# Patient Record
Sex: Male | Born: 1995 | Hispanic: No | Marital: Single | Smoking: Never smoker
Health system: Southern US, Community
[De-identification: ages and names within clinical notes are randomized; demographics above are authoritative.]

---
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
## 2014-06-02 ENCOUNTER — Encounter (HOSPITAL_COMMUNITY): Payer: Self-pay | Admitting: Nurse Practitioner

## 2014-06-02 ENCOUNTER — Emergency Department (HOSPITAL_COMMUNITY)
Admission: EM | Admit: 2014-06-02 | Discharge: 2014-06-02 | Disposition: A | Discharge status: Home / Self Care | Payer: Medicaid Other | Attending: Emergency Medicine | Admitting: Emergency Medicine

## 2014-06-02 DIAGNOSIS — J020 Streptococcal pharyngitis: Secondary | ICD-10-CM

## 2014-06-02 LAB — RAPID STREP SCREEN (MED CTR MEBANE ONLY): Streptococcus, Group A Screen (Direct): POSITIVE — AB

## 2014-06-02 MED ORDER — AMOXICILLIN 500 MG PO CAPS: 500.0000 mg | ORAL_CAPSULE | Freq: Three times a day (TID) | ORAL | Status: AC

## 2014-06-02 NOTE — Discharge Instructions (Signed)
Please take all of your antibiotics until finished!   You may develop abdominal discomfort or diarrhea from the antibiotic.  You may help offset this with probiotics which you can buy or get in yogurt. Do not eat  or take the probiotics until 2 hours after your antibiotic.   Strep Throat Strep throat is an infection of the throat caused by a bacteria named Streptococcus pyogenes. Your health care provider may call the infection streptococcal "tonsillitis" or "pharyngitis" depending on whether there are signs of inflammation in the tonsils or back of the throat. Strep throat is most common in children aged 5-15 years during the cold months of the year, but it can occur in people of any age during any season. This infection is spread from person to person (contagious) through coughing, sneezing, or other close contact. SIGNS AND SYMPTOMS   Fever or chills.  Painful, swollen, red tonsils or throat.  Pain or difficulty when swallowing.  White or yellow spots on the tonsils or throat.  Swollen, tender lymph nodes or "glands" of the neck or under the jaw.  Red rash all over the body (rare). DIAGNOSIS  Many different infections can cause the same symptoms. A test must be done to confirm the diagnosis so the right treatment can be given. A "rapid strep test" can help your health care provider make the diagnosis in a few minutes. If this test is not available, a light swab of the infected area can be used for a throat culture test. If a throat culture test is done, results are usually available in a day or two. TREATMENT  Strep throat is treated with antibiotic medicine. HOME CARE INSTRUCTIONS   Gargle with 1 tsp of salt in 1 cup of warm water, 3-4 times per day or as needed for comfort.  Family members who also have a sore throat or fever should be tested for strep throat and treated with antibiotics if they have the strep infection.  Make sure everyone in your household washes their hands  well.  Do not share food, drinking cups, or personal items that could cause the infection to spread to others.  You may need to eat a soft food diet until your sore throat gets better.  Drink enough water and fluids to keep your urine clear or pale yellow. This will help prevent dehydration.  Get plenty of rest.  Stay home from school, day care, or work until you have been on antibiotics for 24 hours.  Take medicines only as directed by your health care provider.  Take your antibiotic medicine as directed by your health care provider. Finish it even if you start to feel better. SEEK MEDICAL CARE IF:   The glands in your neck continue to enlarge.  You develop a rash, cough, or earache.  You cough up green, yellow-brown, or bloody sputum.  You have pain or discomfort not controlled by medicines.  Your problems seem to be getting worse rather than better.  You have a fever. SEEK IMMEDIATE MEDICAL CARE IF:   You develop any new symptoms such as vomiting, severe headache, stiff or painful neck, chest pain, shortness of breath, or trouble swallowing.  You develop severe throat pain, drooling, or changes in your voice.  You develop swelling of the neck, or the skin on the neck becomes red and tender.  You develop signs of dehydration, such as fatigue, dry mouth, and decreased urination.  You become increasingly sleepy, or you cannot wake up completely. MAKE SURE  YOU:  Understand these instructions.  Will watch your condition.  Will get help right away if you are not doing well or get worse. Document Released: 06/12/2000 Document Revised: 10/30/2013 Document Reviewed: 08/14/2010 [facility] Patient Information 2015 [employer], [place]. This information is not intended to replace advice given to you by your health care provider. Make sure you discuss any questions you have with your health care provider.

## 2014-06-02 NOTE — ED Provider Notes (Signed)
CSN: [id number]     Arrival date & time 06/02/14  1054 History  This chart was scribed for Arthor CaptainAbigail Finlee Milo, PA-C, working with Flint MelterElliott L Wentz, MD by Chestine SporeSoijett Blue, ED Scribe. The patient was seen in room TR06C/TR06C at 11:34 AM.    Chief Complaint  Patient presents with  . Jaw Pain    The history is provided by the patient. No language interpreter was used.   HPI Comments: Tony Carey is a 18 y.o. male who presents to the Emergency Department complaining of right jaw pain onset wednesday.  There is pain hen he swallows. He states that he is having associated symptoms of voice change. He states that he has tried Advil 10 AM PTA with no relief for his symptoms. He denies ear pain, rhinorrhea, sore throat, cough, tooth pain, vomiting, and any other symptoms.  History reviewed. No pertinent past medical history. History reviewed. No pertinent past surgical history. History reviewed. No pertinent family history. History  Substance Use Topics  . Smoking status: Never Smoker   . Smokeless tobacco: Not on file  . Alcohol Use: No    Review of Systems  HENT: Positive for facial swelling (right jaw swelling) and voice change. Negative for dental problem, ear pain and sore throat.   Respiratory: Negative for cough.   Gastrointestinal: Negative for vomiting.      Allergies  Review of patient's allergies indicates no known allergies.  Home Medications   Prior to Admission medications   Not on File   BP 134/77 mmHg  Pulse 99  Temp(Src) 98.6 F (37 C) (Oral)  Resp 16  SpO2 97%  Physical Exam  Constitutional: He is oriented to person, place, and time. He appears well-developed and well-nourished. No distress.  HENT:  Head: Normocephalic and atraumatic.  Mouth/Throat: Uvula is midline. Posterior oropharyngeal erythema present. No oropharyngeal exudate or tonsillar abscesses.  Hypertrophic right slightly greater than left.  Eyes: EOM are normal.  Neck: Neck supple. No  tracheal deviation present.  Cardiovascular: Normal rate.   Pulmonary/Chest: Effort normal. No respiratory distress.  Musculoskeletal: Normal range of motion.  Lymphadenopathy:       Head (right side): Tonsillar adenopathy present.  Neurological: He is alert and oriented to person, place, and time.  Skin: Skin is warm and dry.  Psychiatric: He has a normal mood and affect. His behavior is normal.  Nursing note and vitals reviewed.   ED Course  Procedures (including critical care time) DIAGNOSTIC STUDIES: Oxygen Saturation is 97% on room air, normal by my interpretation.    COORDINATION OF CARE: 11:42 AM-Discussed treatment plan which includes rapid strep with pt at bedside and pt agreed to plan.   Labs Review Labs Reviewed - No data to display  Imaging Review No results found.   EKG Interpretation None      MDM   Final diagnoses:  Strep throat    Pt w/ cervical lymphadenopathy diagnosis of strep  Pt Presentation non concerning for PTA or infxn spread to soft tissue. No trismus or uvula deviation. Specific return precautions discussed. Pt able to drink water in ED without difficulty with intact air way. Recommended PCP follow up.    I personally performed the services described in this documentation, which was scribed in my presence. The recorded information has been reviewed and is accurate.    Arthor Captainbigail Marsia Cino, PA-C 06/02/14 1319  Flint MelterElliott L Wentz, MD 06/02/14 [phone]

## 2014-06-02 NOTE — ED Notes (Signed)
he's had pain and swelling to R jaw since Wednesday. He took advil with some relief. Denies sore throat, cough. He did have chills before he took the advil

## 2014-06-02 NOTE — ED Notes (Signed)
Declined W/C at D/C and was escorted to lobby by RN. 

## 2018-06-28 ENCOUNTER — Other Ambulatory Visit: Payer: Self-pay

## 2018-06-28 ENCOUNTER — Encounter (HOSPITAL_COMMUNITY): Payer: Self-pay | Admitting: Emergency Medicine

## 2018-06-28 ENCOUNTER — Emergency Department (HOSPITAL_COMMUNITY)
Admission: EM | Admit: 2018-06-28 | Discharge: 2018-06-28 | Disposition: A | Discharge status: Home / Self Care | Payer: Self-pay | Attending: Emergency Medicine | Admitting: Emergency Medicine

## 2018-06-28 DIAGNOSIS — J029 Acute pharyngitis, unspecified: Secondary | ICD-10-CM

## 2018-06-28 LAB — GROUP A STREP BY PCR: GROUP A STREP BY PCR: NOT DETECTED

## 2018-06-28 MED ORDER — ACETAMINOPHEN 500 MG PO TABS
1000.0000 mg | ORAL_TABLET | Freq: Once | ORAL | Status: AC
  Administered 2018-06-28: 1000 mg via ORAL
  Filled 2018-06-28: qty 2

## 2018-06-28 NOTE — ED Provider Notes (Signed)
[name]   CSN: [id number] Arrival date & time: 06/28/18  [id number]     History   Chief Complaint Chief Complaint  Patient presents with  . Sore Throat  . Cough  . Fever    HPI Tony PortelaDaniel Carey is a 22 y.o. male.  HPI   Patient is a 22 year old male with no significant past medical history presents the emergency department today for evaluation of a sore throat that began yesterday.  Patient reports severe pain to the left side of his throat that is worse with swallowing.  He has tried taking DayQuil and NyQuil without significant relief.  He also reports rhinorrhea, congestion, fevers and chills.  No significant cough or shortness of breath or abdominal pain.  History reviewed. No pertinent past medical history.  There are no active problems to display for this patient.   History reviewed. No pertinent surgical history.      Home Medications    Prior to Admission medications   Medication Sig Start Date End Date Taking? Authorizing Provider  amoxicillin (AMOXIL) 500 MG capsule Take 1 capsule (500 mg total) by mouth 3 (three) times daily. 06/02/14   Arthor CaptainHarris, Abigail, PA-C    Family History No family history on file.  Social History Social History   Tobacco Use  . Smoking status: Never Smoker  . Smokeless tobacco: Never Used  Substance Use Topics  . Alcohol use: No  . Drug use: No     Allergies   Patient has no known allergies.   Review of Systems Review of Systems  Constitutional: Positive for chills and fever.  HENT: Positive for congestion, rhinorrhea and sore throat.   Eyes: Negative for visual disturbance.  Respiratory: Negative for cough and shortness of breath.   Cardiovascular: Negative for chest pain.  Gastrointestinal: Negative for abdominal pain, constipation, diarrhea, nausea and vomiting.  Genitourinary: Negative for dysuria.  Musculoskeletal: Negative for back pain.  Neurological:  Negative for headaches.   Physical Exam Updated Vital Signs BP 140/82 (BP Location: Right Arm)   Pulse (!) 106   Temp 99.6 F (37.6 C) (Oral)   Resp 17   SpO2 99%   Physical Exam Vitals signs and nursing note reviewed.  Constitutional:      Appearance: He is well-developed.  HENT:     Head: Normocephalic and atraumatic.     Nose: No congestion or rhinorrhea.     Mouth/Throat:     Mouth: Mucous membranes are moist.     Pharynx: Posterior oropharyngeal erythema present.     Comments: Posterior pharyngeal edema.  No tonsillar edema or exudates. Eyes:     Conjunctiva/sclera: Conjunctivae normal.  Neck:     Musculoskeletal: Neck supple.  Cardiovascular:     Rate and Rhythm: Regular rhythm. Tachycardia present.     Heart sounds: Normal heart sounds. No murmur.  Pulmonary:     Effort: Pulmonary effort is normal. No respiratory distress.     Breath sounds: Normal breath sounds. No stridor. No wheezing or rhonchi.  Abdominal:     General: Bowel sounds are normal.     Palpations: Abdomen is soft.     Tenderness: There is no abdominal tenderness.  Lymphadenopathy:     Cervical: Cervical adenopathy present.  Skin:    General: Skin is warm and dry.  Neurological:     Mental Status: He is alert.     ED Treatments / Results  Labs (all labs ordered are listed, but only abnormal  results are displayed) Labs Reviewed  GROUP A STREP BY PCR    EKG None  Radiology No results found.  Procedures Procedures (including critical care time)  Medications Ordered in ED Medications  acetaminophen (TYLENOL) tablet 1,000 mg (1,000 mg Oral Given 06/28/18 [id number])     Initial Impression / Assessment and Plan / ED Course  I have reviewed the triage vital signs and the nursing notes.  Pertinent labs & imaging results that were available during my care of the patient were reviewed by me and considered in my medical decision making (see chart for details).   Final Clinical  Impressions(s) / ED Diagnoses   Final diagnoses:  Pharyngitis, unspecified etiology    Pt afebrile without tonsillar exudate, negative strep. Presents with mild cervical lymphadenopathy, & dysphagia; diagnosis of viral pharyngitis. No abx indicated. DC w symptomatic tx for pain  Pt does not appear dehydrated, but did discuss importance of water rehydration. Presentation non concerning for PTA or infxn spread to soft tissue. No trismus or uvula deviation. Specific return precautions discussed. Pt able to drink water in ED without difficulty with intact air way. Recommended PCP follow up.   ED Discharge Orders    None       Rayne DuCouture, Jerame Hedding S, PA-C 06/28/18 1141    Maia PlanLong, Joshua G, MD 06/28/18 1919

## 2018-06-28 NOTE — ED Triage Notes (Signed)
Pt. Stated, I have strep throat , cough with a fever since yesterday.

## 2018-06-28 NOTE — Discharge Instructions (Addendum)
You may alternate taking Tylenol and Ibuprofen as needed for pain control. You may take 400-600 mg of ibuprofen every 6 hours and 500-1000 mg of Tylenol every 6 hours. Do not exceed 4000 mg of Tylenol daily as this can lead to liver damage. Also, make sure to take Ibuprofen with meals as it can cause an upset stomach. Do not take other NSAIDs while taking Ibuprofen such as (Aleve, Naprosyn, Aspirin, Celebrex, etc) and do not take more than the prescribed dose as this can lead to ulcers and bleeding in your GI tract. ° °Please follow up with your primary doctor within the next 7-10 days for re-evaluation and further treatment of your symptoms.  ° °Please return to the ER sooner if you have any new or worsening symptoms. ° °

## 2018-07-18 ENCOUNTER — Other Ambulatory Visit: Payer: Self-pay

## 2018-07-18 ENCOUNTER — Emergency Department (HOSPITAL_COMMUNITY)
Admission: EM | Admit: 2018-07-18 | Discharge: 2018-07-18 | Disposition: A | Discharge status: Home / Self Care | Payer: Self-pay | Attending: Emergency Medicine | Admitting: Emergency Medicine

## 2018-07-18 ENCOUNTER — Encounter (HOSPITAL_COMMUNITY): Payer: Self-pay | Admitting: Emergency Medicine

## 2018-07-18 DIAGNOSIS — J020 Streptococcal pharyngitis: Secondary | ICD-10-CM | POA: Insufficient documentation

## 2018-07-18 LAB — GROUP A STREP BY PCR: GROUP A STREP BY PCR: DETECTED — AB

## 2018-07-18 MED ORDER — IBUPROFEN 800 MG PO TABS: 800.0000 mg | ORAL_TABLET | Freq: Once | ORAL | Status: DC

## 2018-07-18 MED ORDER — DEXAMETHASONE SODIUM PHOSPHATE 10 MG/ML IJ SOLN
10.0000 mg | Freq: Once | INTRAMUSCULAR | Status: AC
  Administered 2018-07-18: 10 mg via INTRAMUSCULAR
  Filled 2018-07-18: qty 1

## 2018-07-18 MED ORDER — PENICILLIN G BENZATHINE 1200000 UNIT/2ML IM SUSP
1.2000 10*6.[IU] | Freq: Once | INTRAMUSCULAR | Status: AC
  Administered 2018-07-18: 1.2 10*6.[IU] via INTRAMUSCULAR
  Filled 2018-07-18: qty 2

## 2018-07-18 MED ORDER — IBUPROFEN 800 MG PO TABS
800.0000 mg | ORAL_TABLET | Freq: Once | ORAL | Status: AC
  Administered 2018-07-18: 800 mg via ORAL
  Filled 2018-07-18: qty 1

## 2018-07-18 NOTE — ED Triage Notes (Addendum)
Pt reports R sided throat pain since Friday.  Seen on 12/31 for pharyngitis.  States he has difficulty swallowing and has been drooling.  Handling secretions at this time.

## 2018-07-18 NOTE — ED Notes (Signed)
Pt reports 3 days of a sore throat with no fever, nausea, or vomiting.

## 2018-07-18 NOTE — ED Notes (Signed)
Patient verbalizes understanding of discharge instructions. Opportunity for questioning and answers were provided. Armband removed by staff, pt discharged from ED home via POV.  

## 2018-07-18 NOTE — ED Provider Notes (Signed)
TIME SEEN: 1:36 AM  CHIEF COMPLAINT: Sore throat  HPI: Patient is a 23 year old male with no significant past medical history who presents to the emergency department with sore throat for the past 3 days.  No known sick contacts.  No fever.  No cough.  No vomiting or diarrhea.  Pain with swallowing.  No changes in his voice.  Able to open mouth fully.  ROS: See HPI Constitutional: no fever  Eyes: no drainage  ENT: no runny nose   Cardiovascular:  no chest pain  Resp: no SOB  GI: no vomiting GU: no dysuria Integumentary: no rash  Allergy: no hives  Musculoskeletal: no leg swelling  Neurological: no slurred speech ROS otherwise negative  PAST MEDICAL HISTORY/PAST SURGICAL HISTORY:  History reviewed. No pertinent past medical history.  MEDICATIONS:  Prior to Admission medications   Medication Sig Start Date End Date Taking? Authorizing Provider  amoxicillin (AMOXIL) 500 MG capsule Take 1 capsule (500 mg total) by mouth 3 (three) times daily. 06/02/14   Arthor Captain, PA-C    ALLERGIES:  No Known Allergies  SOCIAL HISTORY:  Social History   Tobacco Use  . Smoking status: Never Smoker  . Smokeless tobacco: Never Used  Substance Use Topics  . Alcohol use: No    FAMILY HISTORY: No family history on file.  EXAM: BP 137/88 (BP Location: Left Arm)   Pulse 93   Temp 100.3 F (37.9 C) (Oral)   Resp 16   SpO2 97%  CONSTITUTIONAL: Alert and oriented and responds appropriately to questions. Well-appearing; well-nourished HEAD: Normocephalic EYES: Conjunctivae clear, pupils appear equal, EOMI ENT: normal nose; moist mucous membranes; patient has pharyngeal erythema without petechiae, bilateral tonsillar hypertrophy without exudate, no uvular deviation, no unilateral swelling, no trismus or drooling, no muffled voice, normal phonation, no stridor, no dental caries present, no drainable dental abscess noted, no Ludwig's angina, tongue sits flat in the bottom of the mouth, no  angioedema, no facial erythema or warmth, no facial swelling; no pain with movement of the neck. NECK: Supple, no meningismus, no nuchal rigidity, no LAD  CARD: RRR; S1 and S2 appreciated; no murmurs, no clicks, no rubs, no gallops RESP: Normal chest excursion without splinting or tachypnea; breath sounds clear and equal bilaterally; no wheezes, no rhonchi, no rales, no hypoxia or respiratory distress, speaking full sentences ABD/GI: Normal bowel sounds; non-distended; soft, non-tender, no rebound, no guarding, no peritoneal signs, no hepatosplenomegaly BACK:  The back appears normal and is non-tender to palpation, there is no CVA tenderness EXT: Normal ROM in all joints; non-tender to palpation; no edema; normal capillary refill; no cyanosis, no calf tenderness or swelling    SKIN: Normal color for age and race; warm; no rash NEURO: Moves all extremities equally PSYCH: The patient's mood and manner are appropriate. Grooming and personal hygiene are appropriate.  MEDICAL DECISION MAKING: Patient here with strep pharyngitis.  No sign of PTA, deep space neck infection, meningitis, pneumonia.  No trismus, voice changes, stridor, drooling.  Will treat with IM Decadron, IM penicillin, ibuprofen.  Recommended alternating Tylenol and ibuprofen at home.  Discussed supportive care instructions.  Given outpatient follow-up.  Discussed return precautions.  At this time, I do not feel there is any life-threatening condition present. I have reviewed and discussed all results (EKG, imaging, lab, urine as appropriate) and exam findings with patient/family. I have reviewed nursing notes and appropriate previous records.  I feel the patient is safe to be discharged home without further emergent workup and  can continue workup as an outpatient as needed. Discussed usual and customary return precautions. Patient/family verbalize understanding and are comfortable with this plan.  Outpatient follow-up has been provided as  needed. All questions have been answered.      Heela Heishman, Layla MawKristen N, DO 07/18/18 [phone]

## 2018-07-18 NOTE — Discharge Instructions (Signed)
You may alternate Tylenol 1000 mg every 6 hours as needed for pain and Ibuprofen 800 mg every 8 hours as needed for pain.  Please take Ibuprofen with food.  You have been given a dose of antibiotics and steroids here in the emergency department.  You will not need to go home on antibiotics after being given this injection of antibiotics in the ED.  You may use over-the-counter Chloraseptic spray and throat lozenges for sore throat.  You may gargle with warm salt water several times a day to help with sore throat.

## 2018-09-17 ENCOUNTER — Emergency Department (HOSPITAL_COMMUNITY)
Admission: EM | Admit: 2018-09-17 | Discharge: 2018-09-17 | Disposition: A | Discharge status: Home / Self Care | Payer: Self-pay | Attending: Emergency Medicine | Admitting: Emergency Medicine

## 2018-09-17 ENCOUNTER — Emergency Department (HOSPITAL_COMMUNITY): Payer: Self-pay

## 2018-09-17 ENCOUNTER — Encounter (HOSPITAL_COMMUNITY): Payer: Self-pay | Admitting: *Deleted

## 2018-09-17 ENCOUNTER — Other Ambulatory Visit: Payer: Self-pay

## 2018-09-17 DIAGNOSIS — J069 Acute upper respiratory infection, unspecified: Secondary | ICD-10-CM | POA: Insufficient documentation

## 2018-09-17 DIAGNOSIS — B9789 Other viral agents as the cause of diseases classified elsewhere: Secondary | ICD-10-CM

## 2018-09-17 MED ORDER — BENZONATATE 100 MG PO CAPS: 100.0000 mg | ORAL_CAPSULE | Freq: Three times a day (TID) | ORAL | 0 refills | Status: AC | PRN

## 2018-09-17 MED ORDER — ALBUTEROL SULFATE HFA 108 (90 BASE) MCG/ACT IN AERS
1.0000 | INHALATION_SPRAY | Freq: Once | RESPIRATORY_TRACT | Status: AC
  Administered 2018-09-17: 2 via RESPIRATORY_TRACT
  Filled 2018-09-17: qty 6.7

## 2018-09-17 NOTE — ED Triage Notes (Signed)
PT reports his left lung  Feels  tight. Pt reports he had asthma as a child

## 2018-09-17 NOTE — ED Provider Notes (Signed)
[facility] EMERGENCY DEPARTMENT Provider Note   CSN: [id number] Arrival date & time: 09/17/18  1317    History   Chief Complaint Chief Complaint  Patient presents with  . Asthma    HPI Tony Carey is a 23 y.o. male history of childhood asthma presents to emergency department today with chief complaint of cough x6 days.  Patient reports his cough is productive and has been intermittent.  He reports yellow and green phlegm. Patient states his left lung feels tight.  Onset was acute, starting 2 hours ago.  Patient estimates the tightness lasted 30 minutes and resolved spontaneously.  Patient denies associated shortness of breath.  He has not taken any medications for symptoms prior to arrival. Pt is asymptomatic at this time.   Patient does state that yesterday his eyes felt warm.  He has a history of seasonal allergies and this feels similar. Denies visual changes, eye pain, photophobia.  He also denies fever, chills, nausea, congestion, vomiting, sore throat, diaphoresis, chest pain, back pain. Patient denies tobacco use. History provided by patient.    History reviewed. No pertinent past medical history.  There are no active problems to display for this patient.   History reviewed. No pertinent surgical history.      Home Medications    Prior to Admission medications   Medication Sig Start Date End Date Taking? Authorizing Provider  amoxicillin (AMOXIL) 500 MG capsule Take 1 capsule (500 mg total) by mouth 3 (three) times daily. 06/02/14   Harris, Abigail, PA-C  benzonatate (TESSALON) 100 MG capsule Take 1 capsule (100 mg total) by mouth 3 (three) times daily as needed for cough. 09/17/18   , Caroleen Hamman, PA-C    Family History History reviewed. No pertinent family history.  Social History Social History   Tobacco Use  . Smoking status: Never Smoker  . Smokeless tobacco: Never Used  Substance Use Topics  . Alcohol use: No  .  Drug use: No     Allergies   Patient has no known allergies.   Review of Systems Review of Systems  Respiratory: Positive for cough.   All other systems reviewed and are negative.    Physical Exam Updated Vital Signs BP 121/81 (BP Location: Right Arm)   Pulse 89   Temp 98.3 F (36.8 C) (Oral)   Resp 16   Ht 5\' 7"  (1.702 m)   Wt 104.3 kg   SpO2 99%   BMI 36.02 kg/m   Physical Exam Vitals signs and nursing note reviewed.  Constitutional:      General: He is not in acute distress.    Appearance: He is well-developed. He is not toxic-appearing.  HENT:     Head: Normocephalic and atraumatic.     Comments: No sinus or temporal tenderness.    Nose: Nose normal.     Mouth/Throat:     Mouth: Mucous membranes are moist.     Pharynx: Oropharynx is clear.     Comments: No erythema to oropharynx, no edema, no exudate, no tonsillar swelling, voice normal, neck supple without lymphadenopathy  Eyes:     General: No allergic shiner or scleral icterus.       Right eye: No discharge.        Left eye: No discharge.     Extraocular Movements: Extraocular movements intact.     Conjunctiva/sclera: Conjunctivae normal.     Right eye: Right conjunctiva is not injected.     Left eye: Left conjunctiva is not  injected.     Pupils: Pupils are equal, round, and reactive to light.  Neck:     Musculoskeletal: Normal range of motion. No muscular tenderness.  Cardiovascular:     Rate and Rhythm: Normal rate and regular rhythm.     Pulses: Normal pulses.     Heart sounds: Normal heart sounds.  Pulmonary:     Effort: Pulmonary effort is normal. No respiratory distress.     Breath sounds: No wheezing, rhonchi or rales.     Comments: Pt is speaking in full sentences. No use of accessory muscles. No pursed lip breathing. No nasal flaring.  Lungs are clear to auscultation in all fields.  Chest:     Chest wall: No tenderness.  Abdominal:     General: There is no distension.     Palpations:  Abdomen is soft.     Tenderness: There is no abdominal tenderness.  Musculoskeletal: Normal range of motion.  Lymphadenopathy:     Cervical: No cervical adenopathy.  Skin:    General: Skin is warm and dry.  Neurological:     Mental Status: He is oriented to person, place, and time.     Comments: Fluent speech, no facial droop.  Psychiatric:        Behavior: Behavior normal.      ED Treatments / Results  Labs (all labs ordered are listed, but only abnormal results are displayed) Labs Reviewed - No data to display  EKG None  Radiology Dg Chest 2 View  Result Date: 09/17/2018 CLINICAL DATA:  Left chest tightness today. EXAM: CHEST - 2 VIEW COMPARISON:  PA and lateral chest 06/06/2016. FINDINGS: Lungs clear. Heart size normal. No pneumothorax or pleural effusion. No acute or focal bony abnormality. IMPRESSION: Negative chest. Electronically Signed   By: Drusilla Kanner M.D.   On: 09/17/2018 14:15    Procedures Procedures (including critical care time)  Medications Ordered in ED Medications  albuterol (PROVENTIL HFA;VENTOLIN HFA) 108 (90 Base) MCG/ACT inhaler 1-2 puff (2 puffs Inhalation Given 09/17/18 1458)     Initial Impression / Assessment and Plan / ED Course  I have reviewed the triage vital signs and the nursing notes.  Pertinent labs & imaging results that were available during my care of the patient were reviewed by me and considered in my medical decision making (see chart for details).  Patient presents with URI type symptoms.  Patient is nontoxic appearing, in no apparent distress, vitals are WNL. Patient is afebrile in the ED, lungs are CTA, CXR reviewed by me is negative for infiltrate, doubt pneumonia. There is no wheezing or signs of respiratory distress. Sxs onset < 7 days, afebrile, no sinus tenderness, doubt acute bacterial sinusitis. Centor score 0, doubt strep pharyngitis. No evidence of AOM on exam. No meningeal signs. No history components or rashes to  raise concern for tic borne illness. Suspect viral vs allergic etiology at this time and will treat supportively with Ibuprofen, zyrtec, and Tessalon. I discussed results, treatment plan, need for PCP follow-up, and return precautions with the patient. Provided opportunity for questions, patient confirmed understanding and is in agreement with plan.   This note was prepared with assistance of Conservation officer, historic buildings. Occasional wrong-word or sound-a-like substitutions may have occurred due to the inherent limitations of voice recognition software.   Final Clinical Impressions(s) / ED Diagnoses   Final diagnoses:  Viral URI with cough    ED Discharge Orders         Ordered  benzonatate (TESSALON) 100 MG capsule  3 times daily PRN     09/17/18 1450           , Caroleen Hamman, PA-C 09/17/18 1603    Margarita Grizzle, MD 09/18/18 [phone]

## 2018-09-17 NOTE — ED Notes (Signed)
Patient verbalizes understanding of discharge instructions . Opportunity for questions and answers were provided . Armband removed by staff ,Pt discharged from ED. W/C  offered at D/C  and Declined W/C at D/C and was escorted to lobby by RN.  

## 2018-09-17 NOTE — Discharge Instructions (Addendum)
You were seen in the emergency today for upper respiratory symptoms, we suspect your symptoms are related to allergies or a virus at this time. There is no cure. Antibiotics are not effective, because the infection is caused by a virus, not by bacteria. I have prescribed you multiple medications to treat your symptoms.   -A prescription has been sent to your pharmacy for an albuterol inhaler.  You can take this as needed for shortness of breath, please use as prescribed.  -Tessalon can be taken once every 8 hours as needed.  This medication is used to treat your cough.  -You can take over-the-counter medications for allergies.  This includes Zyrtec, Claritin, or Singulair. Please take as directed. Usually it is a daily pill you take.  -Ibuprofen to be taken once every 8 hours as needed for pain. Please take this medicine with food as it can cause stomach upset and at worst stomach bleeding. Do not take other NSAIDs such as motrin, aleve, advil, naproxen, mobic, etc as they are similar. You make take tylenol per over the counter dosing with this medicine safely.  More Symptomatic Treatments Options: Treatment is directed at relieving symptoms.  Treatment may include:  Increased fluid intake. Sports drinks offer valuable electrolytes, sugars, and fluids.  Breathing heated mist or steam (vaporizer or shower).  Eating chicken soup or other clear broths, and maintaining good nutrition.  Getting plenty of rest.  Using gargles or lozenges for comfort.  Controlling fevers with ibuprofen or acetaminophen as directed by your caregiver.  Increasing usage of your inhaler if you have asthma.  Return to work when your temperature has returned to normal.    You will need to follow-up with your primary care provider in 1 week if your symptoms have not improved.  If you do not have a primary care provider one is provided in your discharge instructions.  I have included information for [facility] if needed. Return to the emergency department for any new or worsening symptoms including but not limited to persistent fever for 5 days, difficulty breathing, chest pain, rashes, passing out, or any other concerns.

## 2019-09-25 IMAGING — DX CHEST - 2 VIEW
2 series · 2 of 2 positions shown · non-contrast
Comparison: PA and lateral chest 06/06/2016.

CLINICAL DATA: Left chest tightness today.

EXAM:
CHEST - 2 VIEW

[chest pa]
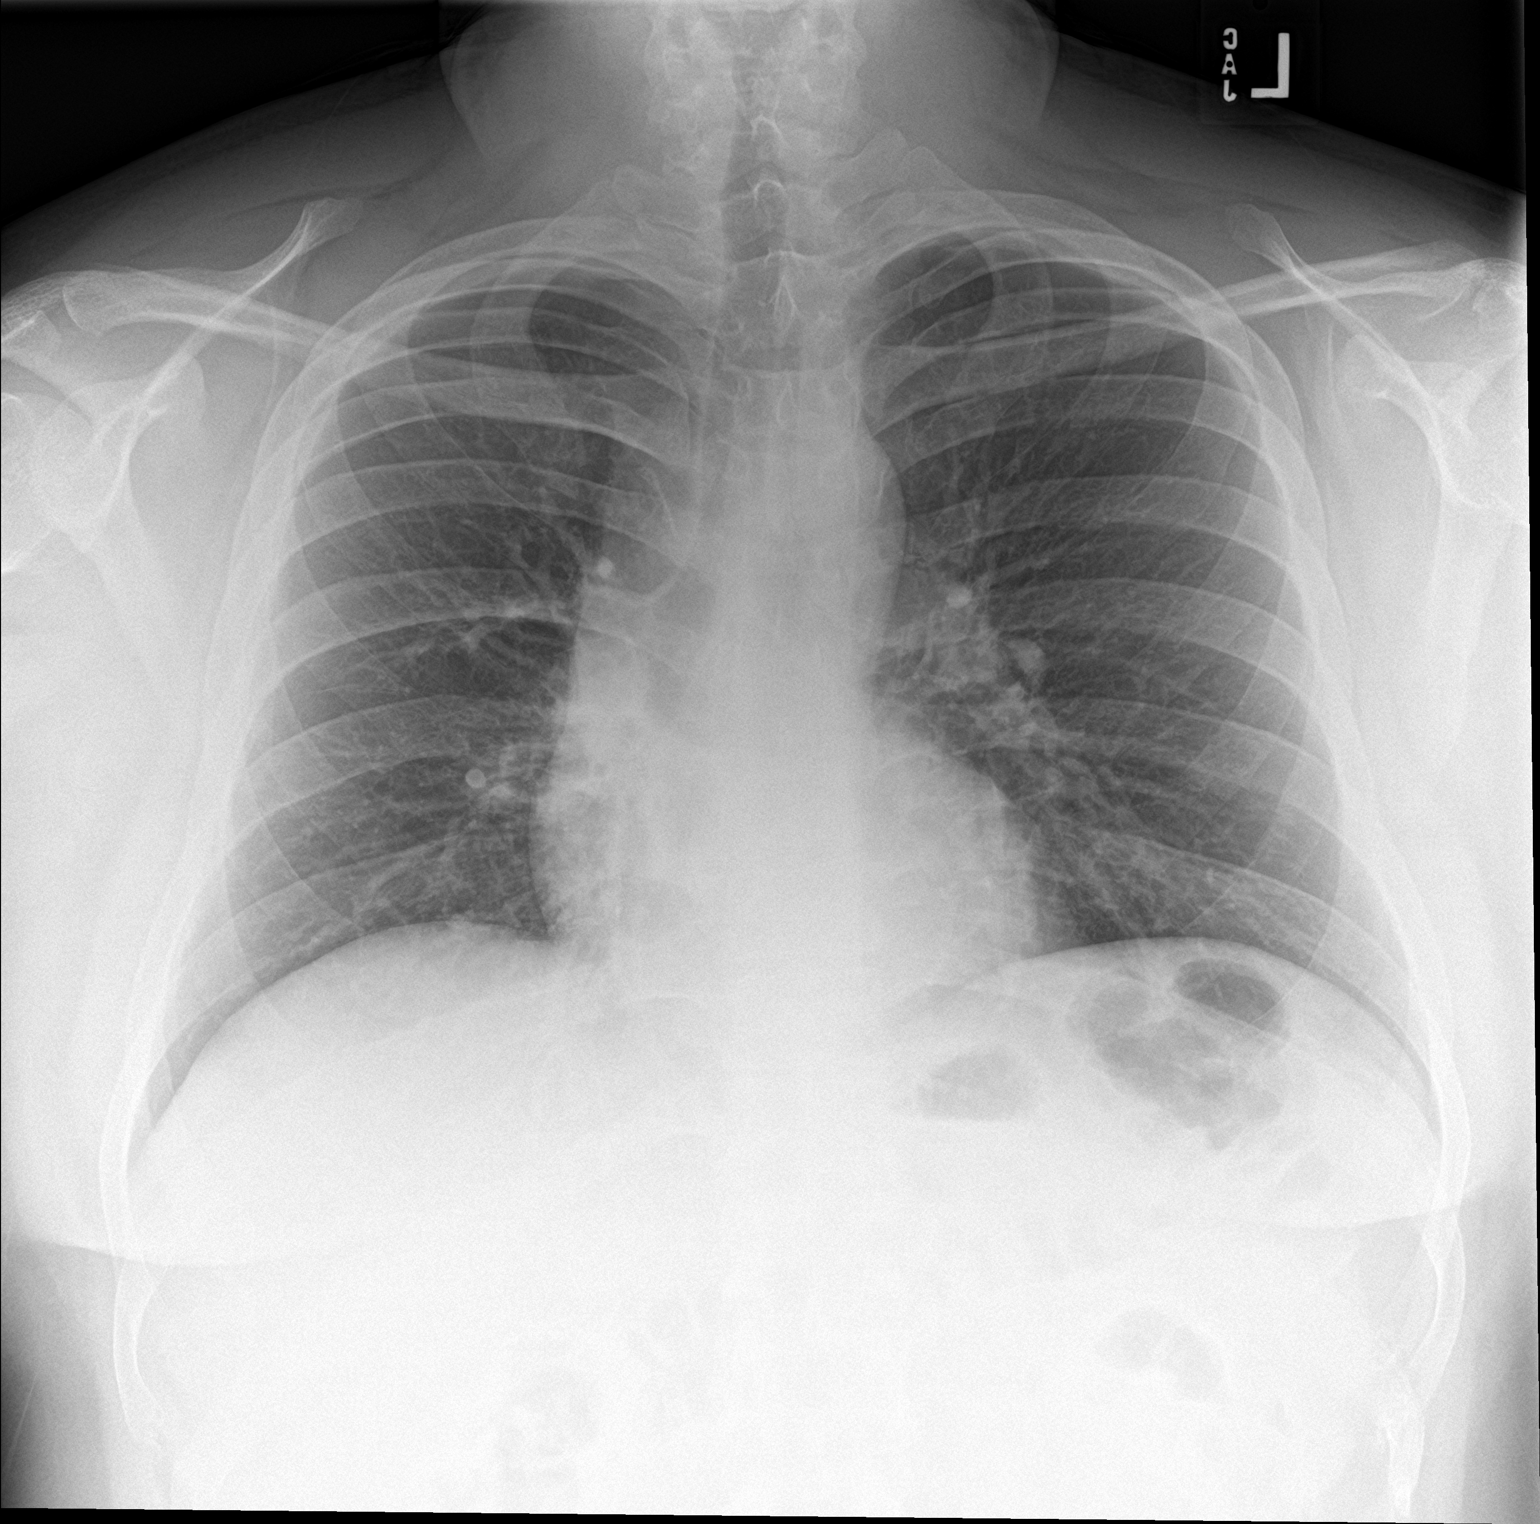

[chest lat]
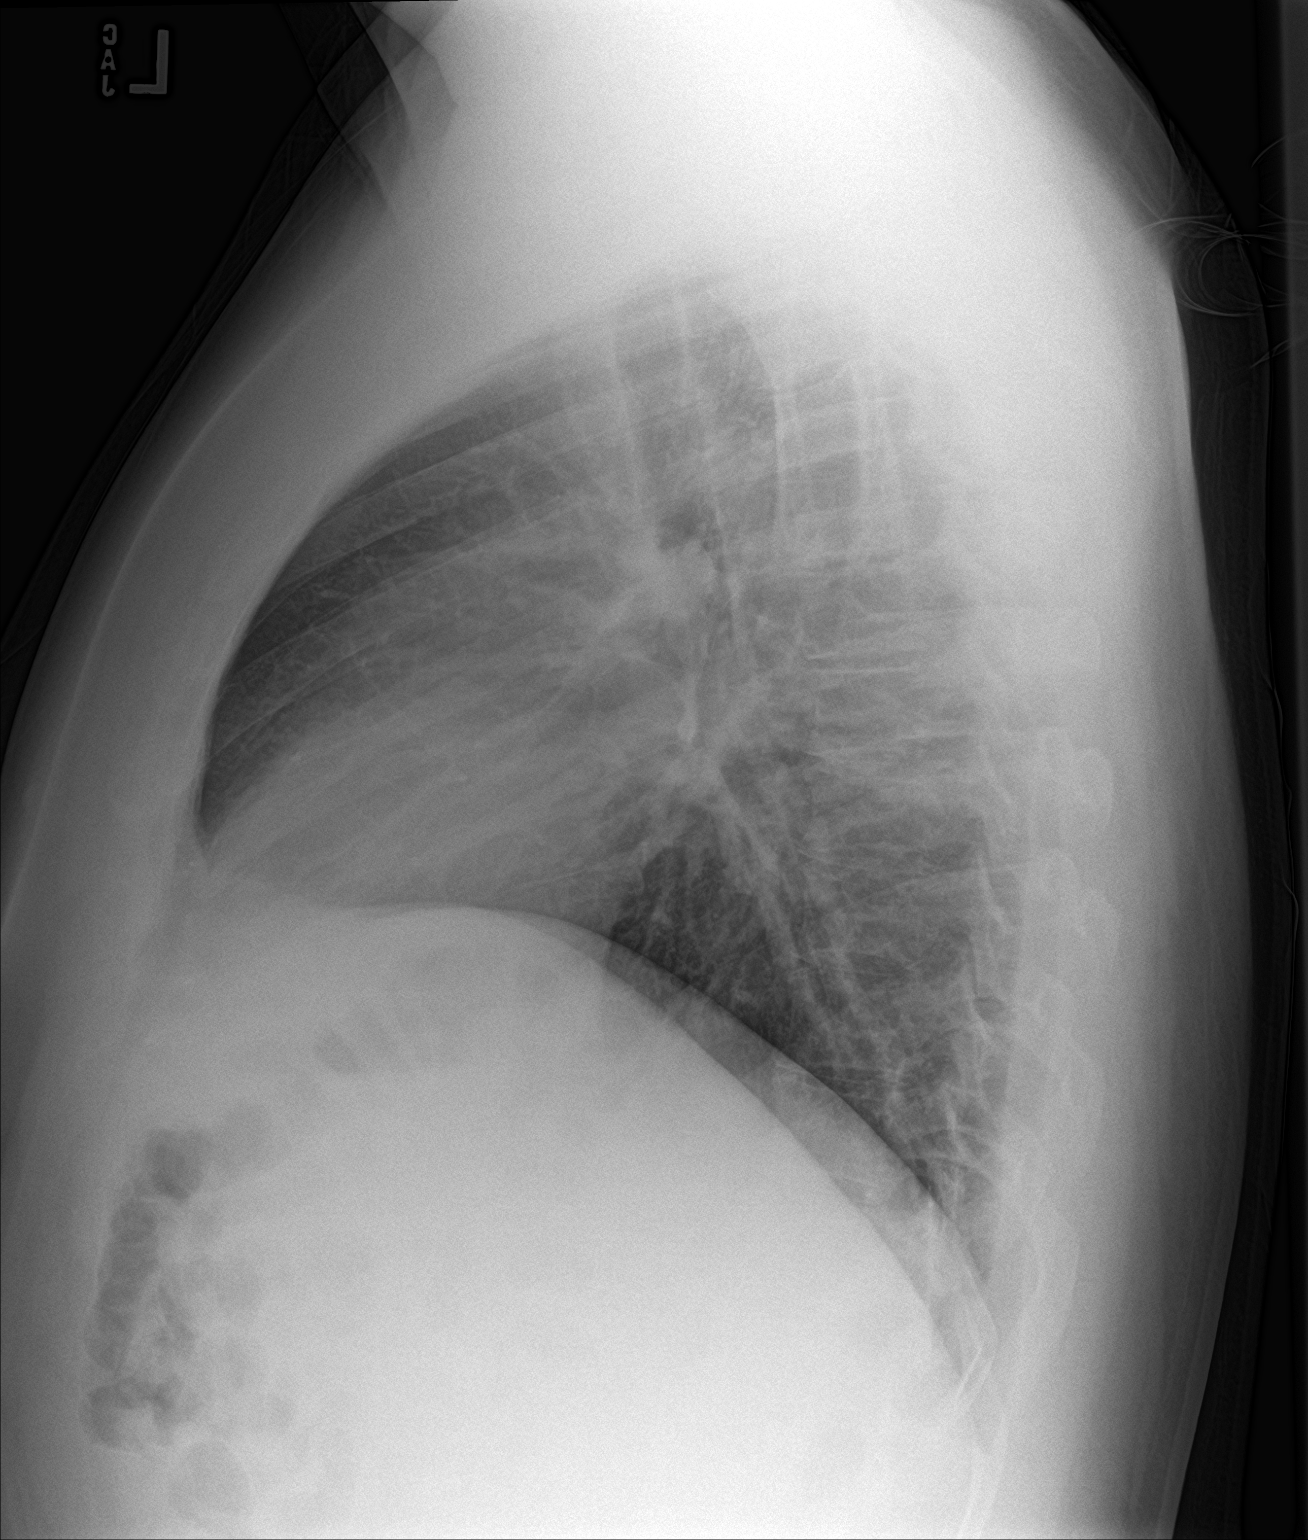

[2 of 2 positions shown; findings below may reference images not displayed]

FINDINGS: Lungs clear. Heart size normal. No pneumothorax or pleural effusion.
No acute or focal bony abnormality.
IMPRESSION: Negative chest.

## 2022-02-21 ENCOUNTER — Emergency Department (HOSPITAL_COMMUNITY): Payer: Self-pay

## 2022-02-21 ENCOUNTER — Emergency Department (HOSPITAL_COMMUNITY)
Admission: EM | Admit: 2022-02-21 | Discharge: 2022-02-21 | Discharge status: Against Medical Advice | Payer: Self-pay | Attending: Student | Admitting: Student

## 2022-02-21 DIAGNOSIS — R110 Nausea: Secondary | ICD-10-CM | POA: Insufficient documentation

## 2022-02-21 DIAGNOSIS — R39198 Other difficulties with micturition: Secondary | ICD-10-CM | POA: Insufficient documentation

## 2022-02-21 DIAGNOSIS — R1032 Left lower quadrant pain: Secondary | ICD-10-CM | POA: Insufficient documentation

## 2022-02-21 DIAGNOSIS — Z5321 Procedure and treatment not carried out due to patient leaving prior to being seen by health care provider: Secondary | ICD-10-CM | POA: Insufficient documentation

## 2022-02-21 LAB — CBC WITH DIFFERENTIAL/PLATELET
Abs Immature Granulocytes: 0.08 10*3/uL — ABNORMAL HIGH (ref 0.00–0.07)
Basophils Absolute: 0.1 10*3/uL (ref 0.0–0.1)
Basophils Relative: 1 %
Eosinophils Absolute: 0.1 10*3/uL (ref 0.0–0.5)
Eosinophils Relative: 1 %
HCT: 43.1 % (ref 39.0–52.0)
Hemoglobin: 14.8 g/dL (ref 13.0–17.0)
Immature Granulocytes: 1 %
Lymphocytes Relative: 20 %
Lymphs Abs: 2 10*3/uL (ref 0.7–4.0)
MCH: 29.2 pg (ref 26.0–34.0)
MCHC: 34.3 g/dL (ref 30.0–36.0)
MCV: 85 fL (ref 80.0–100.0)
Monocytes Absolute: 0.8 10*3/uL (ref 0.1–1.0)
Monocytes Relative: 7 %
Neutro Abs: 7.2 10*3/uL (ref 1.7–7.7)
Neutrophils Relative %: 70 %
Platelets: 258 10*3/uL (ref 150–400)
RBC: 5.07 MIL/uL (ref 4.22–5.81)
RDW: 13.3 % (ref 11.5–15.5)
WBC: 10.2 10*3/uL (ref 4.0–10.5)
nRBC: 0 % (ref 0.0–0.2)

## 2022-02-21 LAB — BASIC METABOLIC PANEL
Anion gap: 10 (ref 5–15)
BUN: 11 mg/dL (ref 6–20)
CO2: 24 mmol/L (ref 22–32)
Calcium: 9.7 mg/dL (ref 8.9–10.3)
Chloride: 104 mmol/L (ref 98–111)
Creatinine, Ser: 0.83 mg/dL (ref 0.61–1.24)
GFR, Estimated: >60 mL/min (ref >60)
Glucose, Bld: 123 mg/dL — ABNORMAL HIGH (ref 70–99)
Potassium: 4 mmol/L (ref 3.5–5.1)
Sodium: 138 mmol/L (ref 135–145)

## 2022-02-21 LAB — URINALYSIS, ROUTINE W REFLEX MICROSCOPIC
Bacteria, UA: NONE SEEN
Bilirubin Urine: NEGATIVE
Glucose, UA: NEGATIVE mg/dL
Ketones, ur: NEGATIVE mg/dL
Leukocytes,Ua: NEGATIVE
Nitrite: NEGATIVE
Protein, ur: NEGATIVE mg/dL
RBC / HPF: >50 RBC/hpf — ABNORMAL HIGH (ref 0–5)
Specific Gravity, Urine: 1.02 (ref 1.005–1.030)
pH: 5 (ref 5.0–8.0)

## 2022-02-21 NOTE — ED Notes (Signed)
Called pt multiple times for vitals with no answer  

## 2022-02-21 NOTE — ED Provider Triage Note (Signed)
Emergency Medicine Provider Triage Evaluation Note  Tony Carey , a 26 y.o. male  was evaluated in triage.  Pt complains of left flank pain.  Patient states that he went to the bathroom at around 3 PM without issue.  States that after that he began having left-sided flank pain and nausea without vomiting.  He states that he feels like the pain is getting progressively worse and since then has not been able to urinate despite trying.  Denies diarrhea, fevers  Review of Systems  Positive:  Negative:   Physical Exam  BP (!) 150/95 (BP Location: Right Arm)   Pulse 81   Temp 98.7 F (37.1 C) (Oral)   Resp 20   SpO2 100%  Gen:   Awake, no distress   Resp:  Normal effort  MSK:   Moves extremities without difficulty  Other:  No CVAT or abdominal pain on palpation. Abdomen is soft   Medical Decision Making  Medically screening exam initiated at 5:42 PM.  Appropriate orders placed.  Tony Carey was informed that the remainder of the evaluation will be completed by another provider, this initial triage assessment does not replace that evaluation, and the importance of remaining in the ED until their evaluation is complete.     Cristopher Peru, PA-C 02/21/22 1743

## 2022-02-21 NOTE — ED Triage Notes (Signed)
Patient with abdominal pain since 1500 hrs after using the restroom. Patient endorses LLQ pain. Patient states he is having difficulty urinating.
